# Patient Record
Sex: Male | Born: 1970 | Race: White | Hispanic: No | Marital: Married | Smoking: Never smoker
Health system: Southern US, Community
[De-identification: ages and names within clinical notes are randomized; demographics above are authoritative.]

## PROBLEM LIST (undated history)

## (undated) DIAGNOSIS — M1990 Unspecified osteoarthritis, unspecified site: Secondary | ICD-10-CM

## (undated) DIAGNOSIS — G43909 Migraine, unspecified, not intractable, without status migrainosus: Secondary | ICD-10-CM

## (undated) DIAGNOSIS — M5020 Other cervical disc displacement, unspecified cervical region: Secondary | ICD-10-CM

## (undated) DIAGNOSIS — H9190 Unspecified hearing loss, unspecified ear: Secondary | ICD-10-CM

## (undated) DIAGNOSIS — I10 Essential (primary) hypertension: Secondary | ICD-10-CM

## (undated) DIAGNOSIS — F5104 Psychophysiologic insomnia: Secondary | ICD-10-CM

## (undated) HISTORY — DX: Migraine, unspecified, not intractable, without status migrainosus: G43.909

## (undated) HISTORY — DX: Unspecified hearing loss, unspecified ear: H91.90

## (undated) HISTORY — PX: VASECTOMY: SHX75

## (undated) HISTORY — DX: Psychophysiologic insomnia: F51.04

---

## 2003-07-27 ENCOUNTER — Emergency Department (HOSPITAL_COMMUNITY): Admission: EM | Admit: 2003-07-27 | Discharge: 2003-07-27 | Payer: Self-pay | Admitting: Emergency Medicine

## 2004-10-19 ENCOUNTER — Ambulatory Visit: Payer: Self-pay | Admitting: Family Medicine

## 2004-11-01 ENCOUNTER — Ambulatory Visit: Payer: Self-pay | Admitting: Family Medicine

## 2006-03-19 ENCOUNTER — Ambulatory Visit: Payer: Self-pay | Admitting: Family Medicine

## 2016-01-29 ENCOUNTER — Emergency Department (HOSPITAL_COMMUNITY): Payer: Self-pay

## 2016-01-29 ENCOUNTER — Encounter (HOSPITAL_COMMUNITY): Payer: Self-pay | Admitting: Emergency Medicine

## 2016-01-29 ENCOUNTER — Emergency Department (HOSPITAL_COMMUNITY)
Admission: EM | Admit: 2016-01-29 | Discharge: 2016-01-29 | Disposition: A | Discharge status: Home / Self Care | Payer: Self-pay | Attending: Emergency Medicine | Admitting: Emergency Medicine

## 2016-01-29 DIAGNOSIS — R4781 Slurred speech: Secondary | ICD-10-CM | POA: Insufficient documentation

## 2016-01-29 DIAGNOSIS — Z792 Long term (current) use of antibiotics: Secondary | ICD-10-CM | POA: Insufficient documentation

## 2016-01-29 DIAGNOSIS — R5383 Other fatigue: Secondary | ICD-10-CM | POA: Insufficient documentation

## 2016-01-29 DIAGNOSIS — I10 Essential (primary) hypertension: Secondary | ICD-10-CM | POA: Insufficient documentation

## 2016-01-29 DIAGNOSIS — F1110 Opioid abuse, uncomplicated: Secondary | ICD-10-CM | POA: Insufficient documentation

## 2016-01-29 DIAGNOSIS — Z79899 Other long term (current) drug therapy: Secondary | ICD-10-CM | POA: Insufficient documentation

## 2016-01-29 DIAGNOSIS — H9190 Unspecified hearing loss, unspecified ear: Secondary | ICD-10-CM | POA: Insufficient documentation

## 2016-01-29 DIAGNOSIS — G8929 Other chronic pain: Secondary | ICD-10-CM | POA: Insufficient documentation

## 2016-01-29 DIAGNOSIS — M1990 Unspecified osteoarthritis, unspecified site: Secondary | ICD-10-CM | POA: Insufficient documentation

## 2016-01-29 HISTORY — DX: Unspecified osteoarthritis, unspecified site: M19.90

## 2016-01-29 HISTORY — DX: Essential (primary) hypertension: I10

## 2016-01-29 LAB — COMPREHENSIVE METABOLIC PANEL
ALT: 17 U/L (ref 17–63)
AST: 19 U/L (ref 15–41)
Albumin: 3.7 g/dL (ref 3.5–5.0)
Alkaline Phosphatase: 58 U/L (ref 38–126)
Anion gap: 9 (ref 5–15)
BUN: <5 mg/dL — ABNORMAL LOW (ref 6–20)
CO2: 28 mmol/L (ref 22–32)
Calcium: 8.7 mg/dL — ABNORMAL LOW (ref 8.9–10.3)
Chloride: 100 mmol/L — ABNORMAL LOW (ref 101–111)
Creatinine, Ser: 0.97 mg/dL (ref 0.61–1.24)
GFR calc Af Amer: >60 mL/min (ref >60)
GFR calc non Af Amer: >60 mL/min (ref >60)
Glucose, Bld: 126 mg/dL — ABNORMAL HIGH (ref 65–99)
Potassium: 3.6 mmol/L (ref 3.5–5.1)
Sodium: 137 mmol/L (ref 135–145)
Total Bilirubin: 0.4 mg/dL (ref 0.3–1.2)
Total Protein: 7 g/dL (ref 6.5–8.1)

## 2016-01-29 LAB — DIFFERENTIAL
Basophils Absolute: 0 10*3/uL (ref 0.0–0.1)
Basophils Relative: 0 %
Eosinophils Absolute: 0.7 10*3/uL (ref 0.0–0.7)
Eosinophils Relative: 10 %
Lymphocytes Relative: 45 %
Lymphs Abs: 3 10*3/uL (ref 0.7–4.0)
Monocytes Absolute: 0.5 10*3/uL (ref 0.1–1.0)
Monocytes Relative: 7 %
Neutro Abs: 2.5 10*3/uL (ref 1.7–7.7)
Neutrophils Relative %: 38 %

## 2016-01-29 LAB — I-STAT CHEM 8, ED
BUN: 5 mg/dL — ABNORMAL LOW (ref 6–20)
Calcium, Ion: 1.12 mmol/L (ref 1.12–1.23)
Chloride: 97 mmol/L — ABNORMAL LOW (ref 101–111)
Creatinine, Ser: 1 mg/dL (ref 0.61–1.24)
Glucose, Bld: 117 mg/dL — ABNORMAL HIGH (ref 65–99)
HCT: 46 % (ref 39.0–52.0)
Hemoglobin: 15.6 g/dL (ref 13.0–17.0)
Potassium: 3.5 mmol/L (ref 3.5–5.1)
Sodium: 140 mmol/L (ref 135–145)
TCO2: 28 mmol/L (ref 0–100)

## 2016-01-29 LAB — RAPID URINE DRUG SCREEN, HOSP PERFORMED
Amphetamines: NOT DETECTED
Barbiturates: NOT DETECTED
Benzodiazepines: NOT DETECTED
Cocaine: NOT DETECTED
Opiates: POSITIVE — AB
Tetrahydrocannabinol: NOT DETECTED

## 2016-01-29 LAB — CBC
HCT: 42 % (ref 39.0–52.0)
Hemoglobin: 13.8 g/dL (ref 13.0–17.0)
MCH: 28.5 pg (ref 26.0–34.0)
MCHC: 32.9 g/dL (ref 30.0–36.0)
MCV: 86.6 fL (ref 78.0–100.0)
Platelets: 329 10*3/uL (ref 150–400)
RBC: 4.85 MIL/uL (ref 4.22–5.81)
RDW: 13.8 % (ref 11.5–15.5)
WBC: 6.7 10*3/uL (ref 4.0–10.5)

## 2016-01-29 LAB — PROTIME-INR
INR: 1.02 (ref 0.00–1.49)
Prothrombin Time: 13.6 seconds (ref 11.6–15.2)

## 2016-01-29 LAB — CBG MONITORING, ED: Glucose-Capillary: 128 mg/dL — ABNORMAL HIGH (ref 65–99)

## 2016-01-29 LAB — I-STAT TROPONIN, ED: Troponin i, poc: 0 ng/mL (ref 0.00–0.08)

## 2016-01-29 LAB — APTT: aPTT: 35 seconds (ref 24–37)

## 2016-01-29 LAB — ETHANOL: Alcohol, Ethyl (B): <5 mg/dL (ref <5)

## 2016-01-29 NOTE — ED Provider Notes (Signed)
CSN: [id number]     Arrival date & time 01/29/16  2002 History   First MD Initiated Contact with Patient 01/29/16 2047     Chief Complaint  Patient presents with  . Aphasia  . Stroke Symptoms     (Consider location/radiation/quality/duration/timing/severity/associated sxs/prior Treatment) HPI  Blood pressure 151/97, pulse 62, temperature 97.8 F (36.6 C), temperature source Oral, resp. rate 20, height 6\' 1"  (1.854 m), weight 106.595 kg, SpO2 98 %.  Cody French is a 45 y.o. male with past medical history significant for hypertension and arthritis with chronic pain complaining of slurred speech onset several weeks ago, her sitting slightly today. Pt denies head trauma, LOC, N/V, change in vision (however wife states that she's noticed it's difficult for him to put his pain and when he is purchasing items via debit card, she's had 2 anterior for him twice in the last 2 days, states he's using his phone normally.), Ataxia, numbness, weakness, chest pain, cervicalgia, fever, chills, illicit drug use. States he has chronic pain and he took 80 mg OxyContin extended release today. States that his voice sounds normal to him, denies sore throat, dysphagia.   Past Medical History  Diagnosis Date  . Hypertension   . Arthritis    History reviewed. No pertinent past surgical history. No family history on file. Social History  Substance Use Topics  . Smoking status: Never Smoker   . Smokeless tobacco: Current User  . Alcohol Use: No    Review of Systems  10 systems reviewed and found to be negative, except as noted in the HPI.   Allergies  Review of patient's allergies indicates no known allergies.  Home Medications   Prior to Admission medications   Medication Sig Start Date End Date Taking? Authorizing Provider  amLODipine (NORVASC) 10 MG tablet Take 10 mg by mouth every evening.   Yes Historical Provider, MD  Armodafinil (NUVIGIL) 50 MG tablet Take 50 mg by mouth daily as needed  (for wakefulness).    Yes Historical Provider, MD  celecoxib (CELEBREX) 200 MG capsule Take 200 mg by mouth daily.   Yes Historical Provider, MD  gabapentin (NEURONTIN) 300 MG capsule Take 900 mg by mouth at bedtime. 10/31/15  Yes Historical Provider, MD  Glucosamine HCl (SM GLUCOSAMINE HCL) 1500 MG TABS Take 1 tablet by mouth every evening.   Yes Historical Provider, MD  oxyCODONE (OXYCONTIN) 80 mg 12 hr tablet Take 80 mg by mouth 2 (two) times daily as needed.   Yes Historical Provider, MD  Testosterone (AXIRON) 30 MG/ACT SOLN Place 60 mg onto the skin daily.   Yes Historical Provider, MD   BP 111/67 mmHg  Pulse 57  Temp(Src) 97.8 F (36.6 C) (Oral)  Resp 12  Ht 6\' 1"  (1.854 m)  Wt 106.595 kg  BMI 31.01 kg/m2  SpO2 93% Physical Exam  Constitutional: He is oriented to person, place, and time. He appears well-developed and well-nourished. No distress.  Appears tired, states that he's been working since 1 AM.  Hard of hearing, does not have his hearing aids with him.  HENT:  Head: Normocephalic and atraumatic.  Mouth/Throat: Oropharynx is clear and moist.  Eyes: Conjunctivae and EOM are normal. Pupils are equal, round, and reactive to light.  No TTP of maxillary or frontal sinuses  No TTP or induration of temporal arteries bilaterally  Neck: Normal range of motion. Neck supple.  FROM to C-spine. Pt can touch chin to chest without discomfort. No TTP of midline cervical spine.  Cardiovascular: Normal rate, regular rhythm and intact distal pulses.   Pulmonary/Chest: Effort normal and breath sounds normal. No stridor. No respiratory distress. He has no wheezes. He has no rales. He exhibits no tenderness.  Abdominal: Soft. Bowel sounds are normal. There is no tenderness.  Musculoskeletal: Normal range of motion. He exhibits no edema or tenderness.  Neurological: He is alert and oriented to person, place, and time. No cranial nerve deficit.  II-Visual fields grossly  intact. III/IV/VI-Extraocular movements intact.  Pupils reactive bilaterally. V/VII-Smile symmetric, equal eyebrow raise,  facial sensation intact VIII- Hearing grossly intact IX/X-Normal gag XI-bilateral shoulder shrug XII-midline tongue extension Motor: 5/5 bilaterally with normal tone and bulk Cerebellar: Normal finger-to-nose  and normal heel-to-shin test.   Romberg negative Ambulates with a coordinated gait   Psychiatric: He has a normal mood and affect.  Nursing note and vitals reviewed.   ED Course  Procedures (including critical care time) Labs Review Labs Reviewed  COMPREHENSIVE METABOLIC PANEL - Abnormal; Notable for the following:    Chloride 100 (*)    Glucose, Bld 126 (*)    BUN <5 (*)    Calcium 8.7 (*)    All other components within normal limits  URINE RAPID DRUG SCREEN, HOSP PERFORMED - Abnormal; Notable for the following:    Opiates POSITIVE (*)    All other components within normal limits  CBG MONITORING, ED - Abnormal; Notable for the following:    Glucose-Capillary 128 (*)    All other components within normal limits  I-STAT CHEM 8, ED - Abnormal; Notable for the following:    Chloride 97 (*)    BUN 5 (*)    Glucose, Bld 117 (*)    All other components within normal limits  PROTIME-INR  APTT  CBC  DIFFERENTIAL  ETHANOL  I-STAT TROPOININ, ED    Imaging Review Ct Head Wo Contrast  01/29/2016  CLINICAL DATA:  45 year old male with slurred speech EXAM: CT HEAD WITHOUT CONTRAST TECHNIQUE: Contiguous axial images were obtained from the base of the skull through the vertex without intravenous contrast. COMPARISON:  None FINDINGS: The ventricles and the sulci are appropriate in size for the patient's age. There is no intracranial hemorrhage. No midline shift or mass effect identified. The gray-white matter differentiation is preserved. There is mild mucoperiosteal thickening of the paranasal sinuses. No air-fluid levels. The mastoid air cells are clear. The  calvarium is intact. The calvarium is intact. IMPRESSION: No acute intracranial pathology. Electronically Signed   By: Elgie Collard M.D.   On: 01/29/2016 22:53   I have personally reviewed and evaluated these images and lab results as part of my medical decision-making.   EKG Interpretation   Date/Time:  Sunday Jan 29 2016 20:29:14 EDT Ventricular Rate:  60 PR Interval:  156 QRS Duration: 102 QT Interval:  416 QTC Calculation: 416 R Axis:   18 Text Interpretation:  Normal sinus rhythm Cannot rule out Anterior infarct  , age undetermined Abnormal ECG Confirmed by Donnald Garre, MD, [facility] [phone])  on 01/29/2016 11:34:29 PM      MDM   Final diagnoses:  Slurred speech  Other fatigue    Filed Vitals:   01/29/16 2200 01/29/16 2230 01/29/16 2300 01/29/16 2330  BP: 119/80 118/74 108/74 111/67  Pulse: 59 58 55 57  Temp:      TempSrc:      Resp: Height:      Weight:      SpO2: 92% 90% 92% 93%  Cody French is 45 y.o. male presenting with Isolated slurred speech onset several weeks ago exam is otherwise nonfocal. She is well out of the code stroke window. This may be related to Polypharmacy   CT head negative, blood work reassuring, negative ethanol, positive opiates.   Take discussed with neuro hospitalist Dr. Roseanne RenoStewart, given his negative workup, length of symptoms and reassuring neurologic exam states he is appropriate for follow-up as an outpatient. Discussed case with wife who will set up appointment, feel that this may be secondary to exhaustion. Work note provided.  Evaluation does not show pathology that would require ongoing emergent intervention or inpatient treatment. Pt is hemodynamically stable and mentating appropriately. Discussed findings and plan with patient/guardian, who agrees with care plan. All questions answered. Return precautions discussed and outpatient follow up given.       Wynetta Emeryicole Tavin Vernet, PA-C 01/29/16 2352  Arby BarretteMarcy Pfeiffer,  MD 02/02/16 [phone]

## 2016-01-29 NOTE — Discharge Instructions (Signed)
Please follow with your primary care doctor in the next 2 days for a check-up. They must obtain records for further management.  ° °Do not hesitate to return to the Emergency Department for any new, worsening or concerning symptoms.  ° °

## 2016-01-29 NOTE — ED Notes (Signed)
Patient transported to CT 

## 2016-01-29 NOTE — ED Notes (Signed)
Per pt family member, has had trouble speaking for the last few weeks, states its been getting worse today. Pt has obvious difficulty speaking and his speech is slurred. Pt is AAOX4 at this time. Answers orientation questions correctly. Grip strength strong and equal, no facial droop, no arm drift. Pt denies pain at this time.

## 2016-02-08 ENCOUNTER — Encounter: Payer: Self-pay | Admitting: Neurology

## 2016-02-08 ENCOUNTER — Ambulatory Visit (INDEPENDENT_AMBULATORY_CARE_PROVIDER_SITE_OTHER): Payer: BLUE CROSS/BLUE SHIELD | Admitting: Neurology

## 2016-02-08 VITALS — BP 158/90 | HR 58 | Ht 73.0 in | Wt 243.0 lb

## 2016-02-08 DIAGNOSIS — R471 Dysarthria and anarthria: Secondary | ICD-10-CM | POA: Diagnosis not present

## 2016-02-08 NOTE — Progress Notes (Signed)
3   Reason for visit: Speech disturbance  Referring physician: [name]  Cody French is a 45 y.o. male  History of present illness:  Cody French is a 45 year old right-handed white male with a history of onset of a speech disturbance that has come on gradually over the last month prior to this evaluation. The patient was in the emergency room on 01/29/2016 with complaints of the speech. The patient has a hesitant at times stuttering type speech. The patient denies any visual changes, numbness or weakness of the arms or legs with exception of some numbness in the hands bilaterally, right greater than left, felt secondary to carpal tunnel syndrome. The patient has some slight neck discomfort at times. He denies any back pain. He denies any alteration in balance or difficulty controlling the bowels or the bladder. The patient is not sleeping well, he takes Nuvigil at times. The patient does have some decreased auditory acuity, he wears hearing aids. The patient has a history of migraine headaches, but this has not been much of an issue for him recently. He is sent to this office for an evaluation. A CT scan of the brain done in the emergency room was unremarkable. The patient indicates that he has had a similar problem with speech greater than 20 years ago when he had a head injury in [place]. This speech problem cleared spontaneously in about 3 months.  Past Medical History  Diagnosis Date  . Hypertension   . Arthritis   . Migraine   . Chronic insomnia   . HOH (hard of hearing)     Past Surgical History  Procedure Laterality Date  . Vasectomy      Family History  Problem Relation Age of Onset  . Stroke Paternal Grandfather   . Migraines Mother     Social history:  reports that he has never smoked. He uses smokeless tobacco. He reports that he does not drink alcohol or use illicit drugs.  Medications:  Prior to Admission medications   Medication Sig Start Date End Date Taking?  Authorizing Provider  amLODipine (NORVASC) 10 MG tablet Take 10 mg by mouth every evening.    Historical Provider, MD  Armodafinil (NUVIGIL) 50 MG tablet Take 50 mg by mouth daily as needed (for wakefulness).     Historical Provider, MD  celecoxib (CELEBREX) 200 MG capsule Take 200 mg by mouth daily.    Historical Provider, MD  gabapentin (NEURONTIN) 300 MG capsule Take 900 mg by mouth at bedtime. 10/31/15   Historical Provider, MD  Glucosamine HCl (SM GLUCOSAMINE HCL) 1500 MG TABS Take 1 tablet by mouth every evening.    Historical Provider, MD  oxyCODONE (OXYCONTIN) 80 mg 12 hr tablet Take 80 mg by mouth 2 (two) times daily as needed.    Historical Provider, MD  Testosterone (AXIRON) 30 MG/ACT SOLN Place 60 mg onto the skin daily.    Historical Provider, MD     No Known Allergies  ROS:  Out of a complete 14 system review of symptoms, the patient complains only of the following symptoms, and all other reviewed systems are negative.  Loss of vision, snoring Runny nose Slurred speech Not enough sleep Snoring  Blood pressure 158/90, pulse 58, height  (1.854 m), weight 243 lb (110.224 kg).  Physical Exam  General: The patient is alert and cooperative at the time of the examination.  Eyes: Pupils are equal, round, and reactive to light. Discs are flat bilaterally.  Neck: The neck  is supple, no carotid bruits are noted.  Respiratory: The respiratory examination is clear.  Cardiovascular: The cardiovascular examination reveals a regular rate and rhythm, no obvious murmurs or rubs are noted.  Skin: Extremities are without significant edema.  Neurologic Exam  Mental status: The patient is alert and oriented x 3 at the time of the examination. The patient has apparent normal recent and remote memory, with an apparently normal attention span and concentration ability.  Cranial nerves: Facial symmetry is present. There is good sensation of the face to pinprick and soft touch  bilaterally. The strength of the facial muscles and the muscles to head turning and shoulder shrug are normal bilaterally. Speech is hesitant, stuttering at times, not aphasic. Extraocular movements are full. Visual fields are full. The tongue is midline, and the patient has symmetric elevation of the soft palate. No obvious hearing deficits are noted.  Motor: The motor testing reveals 5 over 5 strength of all 4 extremities. Good symmetric motor tone is noted throughout.  Sensory: Sensory testing is intact to pinprick, soft touch, vibration sensation, and position sense on all 4 extremities. No evidence of extinction is noted.  Coordination: Cerebellar testing reveals good finger-nose-finger and heel-to-shin bilaterally.  Gait and station: Gait is normal. Tandem gait is normal. Romberg is negative. No drift is seen.  Reflexes: Deep tendon reflexes are symmetric and normal bilaterally. Toes are downgoing bilaterally.   CT head 01/29/16:  IMPRESSION: No acute intracranial pathology.  * CT scan images were reviewed online. I agree with the written report.    Assessment/Plan:  1. Speech alteration  The patient has had some problems with hesitancy of speech in stuttering speech over the last month. The clinical examination today is otherwise unremarkable. This issue could be psychogenic in nature, but further workup is indicated. He will have MRI of the brain with and without gadolinium enhancement, and an EEG study. He will follow-up in 3 months.   [name]. [name] MD 02/08/2016 7:52 PM  [facility] [address] [place], [place] [id number]  Phone [phone] Fax [phone]

## 2016-02-22 ENCOUNTER — Telehealth: Payer: Self-pay | Admitting: Neurology

## 2016-02-22 NOTE — Telephone Encounter (Signed)
Pt returned call  about MRI appt. They are requesting a call back today. Please call Candy at [phone]

## 2016-02-24 NOTE — Telephone Encounter (Signed)
Called Cody French back as requested and [facility]. Asked her to call me back at her earliest convenience.

## 2016-02-24 NOTE — Telephone Encounter (Signed)
Spoke with the patient and scheduled apt. Also spoke with his wife and explained I had tried calling him to schedule and left a VM, the patient stated that he had just checked his messages and received the message.

## 2016-02-24 NOTE — Telephone Encounter (Signed)
Pt's wife is requesting a call about MRI scheduling as soon as possible.

## 2016-03-05 ENCOUNTER — Ambulatory Visit (INDEPENDENT_AMBULATORY_CARE_PROVIDER_SITE_OTHER): Payer: BLUE CROSS/BLUE SHIELD

## 2016-03-05 DIAGNOSIS — R471 Dysarthria and anarthria: Secondary | ICD-10-CM

## 2016-03-07 ENCOUNTER — Ambulatory Visit (INDEPENDENT_AMBULATORY_CARE_PROVIDER_SITE_OTHER): Payer: BLUE CROSS/BLUE SHIELD

## 2016-03-07 ENCOUNTER — Telehealth: Payer: Self-pay | Admitting: Neurology

## 2016-03-07 DIAGNOSIS — R471 Dysarthria and anarthria: Secondary | ICD-10-CM | POA: Diagnosis not present

## 2016-03-07 NOTE — Telephone Encounter (Signed)
I called the patient. EEG study was normal, MRI the brain is pending.

## 2016-03-07 NOTE — Procedures (Signed)
    History:  Cody DinningRobert French is a 45 year old gentleman with a history of onset of a stuttering speech pattern that began in early May 2017. The patient is being evaluated for this issue.  This is a routine EEG. No skull defects are noted. Medications include Norvasc, Nuvigil, Celebrex, gabapentin, OxyContin, and testosterone supplementation.   EEG classification: Normal awake  Description of the recording: The background rhythms of this recording consists of a fairly well modulated medium amplitude alpha rhythm of 9 Hz that is reactive to eye opening and closure. As the record progresses, the patient appears to remain in the waking state throughout the recording. Photic stimulation was not performed. Hyperventilation was performed, resulting in a minimal buildup of the background rhythm activities without significant slowing seen. At no time during the recording does there appear to be evidence of spike or spike wave discharges or evidence of focal slowing. EKG monitor shows no evidence of cardiac rhythm abnormalities with a heart rate of 66.  Impression: This is a normal EEG recording in the waking state. No evidence of ictal or interictal discharges are seen.

## 2016-03-08 ENCOUNTER — Telehealth: Payer: Self-pay | Admitting: Neurology

## 2016-03-08 MED ORDER — GADOPENTETATE DIMEGLUMINE 469.01 MG/ML IV SOLN: 20.0000 mL | Freq: Once | INTRAVENOUS | Status: AC | PRN

## 2016-03-08 NOTE — Telephone Encounter (Signed)
Called and spoke to pt. Let him know that MRI was also normal. He continues to have problems w/ his speech. Said that he would check his work schedule and then call back to schedule sooner f/u appt.

## 2016-03-08 NOTE — Telephone Encounter (Signed)
Wife called to request MRI results, please call [phone].

## 2016-03-08 NOTE — Telephone Encounter (Signed)
I called the patient. MRI the brain is unremarkable. EEG study was normal. If the speech issue continues, we may consider speech therapy for him, this could potentially be a psychogenic event.   MRI brain 03/07/16:  IMPRESSION: This is a normal MRI of the brain with and without contrast.

## 2016-05-10 ENCOUNTER — Ambulatory Visit: Payer: BLUE CROSS/BLUE SHIELD | Admitting: Neurology

## 2016-05-15 ENCOUNTER — Emergency Department (HOSPITAL_COMMUNITY): Payer: No Typology Code available for payment source

## 2016-05-15 ENCOUNTER — Emergency Department (HOSPITAL_COMMUNITY)
Admission: EM | Admit: 2016-05-15 | Discharge: 2016-05-15 | Disposition: A | Discharge status: Home / Self Care | Payer: No Typology Code available for payment source | Attending: Emergency Medicine | Admitting: Emergency Medicine

## 2016-05-15 ENCOUNTER — Encounter (HOSPITAL_COMMUNITY): Payer: Self-pay

## 2016-05-15 DIAGNOSIS — Y999 Unspecified external cause status: Secondary | ICD-10-CM | POA: Diagnosis not present

## 2016-05-15 DIAGNOSIS — M5410 Radiculopathy, site unspecified: Secondary | ICD-10-CM

## 2016-05-15 DIAGNOSIS — Y939 Activity, unspecified: Secondary | ICD-10-CM | POA: Insufficient documentation

## 2016-05-15 DIAGNOSIS — M79604 Pain in right leg: Secondary | ICD-10-CM | POA: Diagnosis not present

## 2016-05-15 DIAGNOSIS — Z79899 Other long term (current) drug therapy: Secondary | ICD-10-CM | POA: Insufficient documentation

## 2016-05-15 DIAGNOSIS — M545 Low back pain: Secondary | ICD-10-CM | POA: Diagnosis present

## 2016-05-15 DIAGNOSIS — I10 Essential (primary) hypertension: Secondary | ICD-10-CM | POA: Diagnosis not present

## 2016-05-15 DIAGNOSIS — Y92410 Unspecified street and highway as the place of occurrence of the external cause: Secondary | ICD-10-CM | POA: Diagnosis not present

## 2016-05-15 DIAGNOSIS — M5441 Lumbago with sciatica, right side: Secondary | ICD-10-CM | POA: Diagnosis not present

## 2016-05-15 MED ORDER — NAPROXEN 500 MG PO TABS: 500.0000 mg | ORAL_TABLET | Freq: Two times a day (BID) | ORAL | 0 refills | Status: AC

## 2016-05-15 MED ORDER — IBUPROFEN 400 MG PO TABS
800.0000 mg | ORAL_TABLET | Freq: Once | ORAL | Status: AC
  Administered 2016-05-15: 800 mg via ORAL
  Filled 2016-05-15: qty 2

## 2016-05-15 MED ORDER — PREDNISONE 20 MG PO TABS: ORAL_TABLET | ORAL | 0 refills | Status: DC

## 2016-05-15 MED ORDER — METHOCARBAMOL 500 MG PO TABS: 500.0000 mg | ORAL_TABLET | Freq: Two times a day (BID) | ORAL | 0 refills | Status: DC

## 2016-05-15 NOTE — Discharge Instructions (Signed)
1. Medications: robaxin, naproxyn, prednisone, usual home medications 2. Treatment: rest, drink plenty of fluids, gentle stretching as discussed, alternate ice and heat 3. Follow Up: Please followup with your primary doctor in 3 days for discussion of your diagnoses and further evaluation after today's visit; if you do not have a primary care doctor use the resource guide provided to find one;  Return to the ER for worsening back pain, difficulty walking, loss of bowel or bladder control or other concerning symptoms    

## 2016-05-15 NOTE — ED Notes (Signed)
Patient transported to X-ray 

## 2016-05-15 NOTE — ED Notes (Signed)
Pt back from radiology 
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
## 2016-05-15 NOTE — ED Notes (Signed)
Pt ambulated independently to restroom. Pt observed walking with slight limp.

## 2016-05-15 NOTE — ED Notes (Signed)
Pt verbalized understanding of d/c instructions and has no further questions. Pt stable and NAD. Pt denied wheelchair and ambulated to the lobby.
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
## 2016-05-15 NOTE — ED Notes (Signed)
Pt transported to xray 

## 2017-01-10 ENCOUNTER — Other Ambulatory Visit: Payer: Self-pay | Admitting: Gastroenterology

## 2017-01-10 DIAGNOSIS — R1310 Dysphagia, unspecified: Secondary | ICD-10-CM

## 2017-01-11 ENCOUNTER — Ambulatory Visit
Admission: RE | Admit: 2017-01-11 | Discharge: 2017-01-11 | Disposition: A | Discharge status: Home / Self Care | Payer: Commercial Managed Care - PPO | Source: Ambulatory Visit | Attending: Gastroenterology | Admitting: Gastroenterology

## 2017-01-11 DIAGNOSIS — R1310 Dysphagia, unspecified: Secondary | ICD-10-CM

## 2017-03-18 ENCOUNTER — Emergency Department (HOSPITAL_COMMUNITY)
Admission: EM | Admit: 2017-03-18 | Discharge: 2017-03-18 | Disposition: A | Discharge status: Home / Self Care | Payer: Worker's Compensation | Attending: Emergency Medicine | Admitting: Emergency Medicine

## 2017-03-18 ENCOUNTER — Emergency Department (HOSPITAL_COMMUNITY): Payer: Worker's Compensation

## 2017-03-18 ENCOUNTER — Encounter (HOSPITAL_COMMUNITY): Payer: Self-pay | Admitting: Emergency Medicine

## 2017-03-18 DIAGNOSIS — I10 Essential (primary) hypertension: Secondary | ICD-10-CM | POA: Insufficient documentation

## 2017-03-18 DIAGNOSIS — W19XXXA Unspecified fall, initial encounter: Secondary | ICD-10-CM

## 2017-03-18 DIAGNOSIS — W11XXXA Fall on and from ladder, initial encounter: Secondary | ICD-10-CM | POA: Insufficient documentation

## 2017-03-18 DIAGNOSIS — R10812 Left upper quadrant abdominal tenderness: Secondary | ICD-10-CM | POA: Diagnosis not present

## 2017-03-18 DIAGNOSIS — Y9389 Activity, other specified: Secondary | ICD-10-CM | POA: Insufficient documentation

## 2017-03-18 DIAGNOSIS — Y929 Unspecified place or not applicable: Secondary | ICD-10-CM | POA: Diagnosis not present

## 2017-03-18 DIAGNOSIS — Y999 Unspecified external cause status: Secondary | ICD-10-CM | POA: Insufficient documentation

## 2017-03-18 DIAGNOSIS — S20212A Contusion of left front wall of thorax, initial encounter: Secondary | ICD-10-CM

## 2017-03-18 DIAGNOSIS — R079 Chest pain, unspecified: Secondary | ICD-10-CM | POA: Diagnosis present

## 2017-03-18 DIAGNOSIS — Z79899 Other long term (current) drug therapy: Secondary | ICD-10-CM | POA: Diagnosis not present

## 2017-03-18 DIAGNOSIS — S20222A Contusion of left back wall of thorax, initial encounter: Secondary | ICD-10-CM | POA: Insufficient documentation

## 2017-03-18 HISTORY — DX: Other cervical disc displacement, unspecified cervical region: M50.20

## 2017-03-18 LAB — CBC WITH DIFFERENTIAL/PLATELET
BASOS ABS: 0 10*3/uL (ref 0.0–0.1)
Basophils Relative: 0 %
EOS PCT: 3 %
Eosinophils Absolute: 0.2 10*3/uL (ref 0.0–0.7)
HEMATOCRIT: 41.4 % (ref 39.0–52.0)
HEMOGLOBIN: 13.7 g/dL (ref 13.0–17.0)
LYMPHS ABS: 2 10*3/uL (ref 0.7–4.0)
LYMPHS PCT: 26 %
MCH: 29.1 pg (ref 26.0–34.0)
MCHC: 33.1 g/dL (ref 30.0–36.0)
MCV: 87.9 fL (ref 78.0–100.0)
Monocytes Absolute: 0.6 10*3/uL (ref 0.1–1.0)
Monocytes Relative: 8 %
NEUTROS PCT: 63 %
Neutro Abs: 4.8 10*3/uL (ref 1.7–7.7)
PLATELETS: 222 10*3/uL (ref 150–400)
RBC: 4.71 MIL/uL (ref 4.22–5.81)
RDW: 13.8 % (ref 11.5–15.5)
WBC: 7.7 10*3/uL (ref 4.0–10.5)

## 2017-03-18 LAB — I-STAT CHEM 8, ED
BUN: 7 mg/dL (ref 6–20)
CALCIUM ION: 1.12 mmol/L — AB (ref 1.15–1.40)
CHLORIDE: 100 mmol/L — AB (ref 101–111)
CREATININE: 0.9 mg/dL (ref 0.61–1.24)
GLUCOSE: 102 mg/dL — AB (ref 65–99)
HCT: 43 % (ref 39.0–52.0)
Hemoglobin: 14.6 g/dL (ref 13.0–17.0)
POTASSIUM: 3.9 mmol/L (ref 3.5–5.1)
Sodium: 138 mmol/L (ref 135–145)
TCO2: 28 mmol/L (ref 0–100)

## 2017-03-18 LAB — I-STAT TROPONIN, ED: TROPONIN I, POC: 0 ng/mL (ref 0.00–0.08)

## 2017-03-18 MED ORDER — MORPHINE SULFATE (PF) 4 MG/ML IV SOLN
4.0000 mg | Freq: Once | INTRAVENOUS | Status: AC
  Administered 2017-03-18: 4 mg via INTRAVENOUS
  Filled 2017-03-18: qty 1

## 2017-03-18 MED ORDER — METAXALONE 800 MG PO TABS: 400.0000 mg | ORAL_TABLET | Freq: Three times a day (TID) | ORAL | 0 refills | Status: AC | PRN

## 2017-03-18 MED ORDER — SODIUM CHLORIDE 0.9 % IV BOLUS (SEPSIS)
500.0000 mL | Freq: Once | INTRAVENOUS | Status: AC
  Administered 2017-03-18: 500 mL via INTRAVENOUS

## 2017-03-18 MED ORDER — IOPAMIDOL (ISOVUE-300) INJECTION 61%
INTRAVENOUS | Status: AC
  Administered 2017-03-18: 100 mL
  Filled 2017-03-18: qty 100

## 2017-03-18 NOTE — ED Notes (Signed)
Portable chest x-ray at this time.

## 2017-03-18 NOTE — ED Notes (Signed)
[place] sandwich meal and coca cola provided to patient - approved by Dr. Rubin PayorPickering.

## 2017-03-18 NOTE — ED Provider Notes (Signed)
[facility] Provider Note   CSN: [id number] Arrival date & time: 03/18/17  1102     History   Chief Complaint Chief Complaint  Patient presents with  . Fall    HPI Cody French is a 46 y.o. male.  HPI Patient presents after fall. Larey SeatFell off a 3 step ladder. Landed onto a table on his left ribs. Severe pain in that area. No loss conscious. Slight pain on left hip 2 and has been able to inability. Refused EMS pain medicine but willing to get some here. Not on blood thinners. Did not hit head. Pain with breathing does not feel short of breath.   Past Medical History:  Diagnosis Date  . Arthritis   . Bulging of cervical intervertebral disc   . Chronic insomnia   . HOH (hard of hearing)   . Hypertension   . Migraine     Patient Active Problem List   Diagnosis Date Noted  . Dysarthria 02/08/2016    Past Surgical History:  Procedure Laterality Date  . VASECTOMY         Home Medications    Prior to Admission medications   Medication Sig Start Date End Date Taking? Authorizing Provider  amLODipine (NORVASC) 10 MG tablet Take 10 mg by mouth daily as needed (blood pressure increase).    Yes [provider]  Armodafinil (NUVIGIL) 50 MG tablet Take 50 mg by mouth daily as needed (for wakefulness).    Yes [provider]  naproxen (NAPROSYN) 500 MG tablet Take 1 tablet (500 mg total) by mouth 2 (two) times daily with a meal. Patient taking differently: Take 500 mg by mouth 2 (two) times daily as needed for mild pain.  05/15/16  Yes Muthersbaugh, Dahlia ClientHannah, PA-C  omeprazole (PRILOSEC) 40 MG capsule Take 40 mg by mouth 2 (two) times daily. 01/22/17  Yes [provider]  oxyCODONE (OXYCONTIN) 80 mg 12 hr tablet Take 40 mg by mouth 2 (two) times daily as needed (pain).    Yes [provider]  Testosterone (AXIRON) 30 MG/ACT SOLN Place 60 mg onto the skin daily.   Yes [provider]  metaxalone (SKELAXIN) 800 MG tablet Take 0.5  tablets (400 mg total) by mouth 3 (three) times daily as needed for muscle spasms. 03/18/17   Benjiman CorePickering, Navaeh Kehres, MD  methocarbamol (ROBAXIN) 500 MG tablet Take 1 tablet (500 mg total) by mouth 2 (two) times daily. Patient not taking: Reported on 03/18/2017 05/15/16   Muthersbaugh, Dahlia ClientHannah, PA-C  predniSONE (DELTASONE) 20 MG tablet 3 tabs po daily x 3 days, then 2 tabs x 3 days, then 1.5 tabs x 3 days, then 1 tab x 3 days, then 0.5 tabs x 3 days Patient not taking: Reported on 03/18/2017 05/15/16   Muthersbaugh, Dahlia ClientHannah, PA-C    Family History Family History  Problem Relation Age of Onset  . Stroke Paternal Grandfather   . Migraines Mother     Social History Social History  Substance Use Topics  . Smoking status: Never Smoker  . Smokeless tobacco: Current User  . Alcohol use No     Allergies   Patient has no known allergies.   Review of Systems Review of Systems  Constitutional: Negative for fever.  HENT: Negative for congestion.   Respiratory: Negative for shortness of breath.   Cardiovascular: Positive for chest pain.  Musculoskeletal: Negative for back pain and neck pain.  Skin: Negative for rash.  Neurological: Negative for headaches.  Hematological: Negative for adenopathy.  Physical Exam Updated Vital Signs BP 133/72 (BP Location: Right Arm)   Pulse 62   Temp 97.8 F (36.6 C) (Oral)   Resp 17   Ht 6\' 2"  (1.88 m)   Wt 99.8 kg (220 lb)   SpO2 100%   BMI 28.25 kg/m   Physical Exam  Constitutional: He appears well-developed.  HENT:  Head: Atraumatic.  Patient appears uncomfortable.  Eyes: Pupils are equal, round, and reactive to light.  Neck: Neck supple.  Cardiovascular: Normal rate.   Pulmonary/Chest: Effort normal. He exhibits tenderness.  Moderate tenderness to left anterior chest wall. No crepitance. No subcutaneous emphysema.  Abdominal: There is tenderness.  Left upper quadrant tenderness without rebound guarding or ecchymosis.  Musculoskeletal:  Normal range of motion. He exhibits no edema.  Neurological: He is alert.  Skin: Skin is warm. Capillary refill takes less than 2 seconds.  Psychiatric: He has a normal mood and affect.     ED Treatments / Results  Labs (all labs ordered are listed, but only abnormal results are displayed) Labs Reviewed  I-STAT CHEM 8, ED - Abnormal; Notable for the following:       Result Value   Chloride 100 (*)    Glucose, Bld 102 (*)    Calcium, Ion 1.12 (*)    All other components within normal limits  CBC WITH DIFFERENTIAL/PLATELET  Rosezena Sensor, ED    EKG  EKG Interpretation  Date/Time:  Monday March 18 2017 11:14:50 EDT Ventricular Rate:  64 PR Interval:    QRS Duration: 105 QT Interval:  389 QTC Calculation: 402 R Axis:   26 Text Interpretation:  Sinus rhythm RSR' in V1 or V2, probably normal variant ST elev, probable normal early repol pattern Confirmed by Rubin Payor  MD, [place] [phone]) on 03/18/2017 11:40:24 AM       Radiology Ct Chest W Contrast  Result Date: 03/18/2017 CLINICAL DATA:  Fall at work.  Worsening left chest pain. EXAM: CT CHEST, ABDOMEN, AND PELVIS WITH CONTRAST TECHNIQUE: Multidetector CT imaging of the chest, abdomen and pelvis was performed following the standard protocol during bolus administration of intravenous contrast. CONTRAST:  ISOVUE-300 IOPAMIDOL (ISOVUE-300) INJECTION 61% COMPARISON:  Chest radiograph from earlier today. FINDINGS: CT CHEST FINDINGS Cardiovascular: Normal heart size. No significant pericardial fluid/thickening. Thoracic aorta is normal in course and caliber. Top-normal caliber main pulmonary artery (3.3 cm diameter). No evidence of acute thoracic aortic injury. No central pulmonary emboli. Mediastinum/Nodes: No pneumomediastinum. No mediastinal hematoma. No discrete thyroid nodules. Unremarkable esophagus. No axillary, mediastinal or hilar lymphadenopathy. Lungs/Pleura: No pneumothorax. No pleural effusion. No acute consolidative  airspace disease, lung masses or new significant pulmonary nodules. No pneumatoceles. Musculoskeletal: No aggressive appearing focal osseous lesions. No fracture detected in the chest. Mild thoracic spondylosis. CT ABDOMEN PELVIS FINDINGS Hepatobiliary: Normal liver with no liver laceration or mass. Normal gallbladder with no radiopaque cholelithiasis. No biliary ductal dilatation. Pancreas: Normal, with no laceration, mass or duct dilation. Spleen: Normal size. No laceration or mass. Adrenals/Urinary Tract: Normal adrenals. No hydronephrosis. No renal laceration. Sub 5 mm hypodense renal cortical lesions in the posterior interpolar kidneys bilaterally, too small to characterize, which require no further follow-up. Normal bladder. Stomach/Bowel: Grossly normal stomach. Normal caliber small bowel with no small bowel wall thickening. Normal appendix. Normal large bowel with no diverticulosis, large bowel wall thickening or pericolonic fat stranding. Vascular/Lymphatic: Normal caliber abdominal aorta. Patent portal, splenic, hepatic and renal veins. No pathologically enlarged lymph nodes in the abdomen or pelvis. Reproductive: Normal size  prostate. Other: No pneumoperitoneum, ascites or focal fluid collection. Musculoskeletal: No aggressive appearing focal osseous lesions. No fracture in the abdomen or pelvis. Mild lumbar spondylosis. IMPRESSION: No acute traumatic injury in the chest, abdomen or pelvis. Electronically Signed   By: Delbert Phenix M.D.   On: 03/18/2017 13:14   Ct Abdomen Pelvis W Contrast  Result Date: 03/18/2017 CLINICAL DATA:  Fall at work.  Worsening left chest pain. EXAM: CT CHEST, ABDOMEN, AND PELVIS WITH CONTRAST TECHNIQUE: Multidetector CT imaging of the chest, abdomen and pelvis was performed following the standard protocol during bolus administration of intravenous contrast. CONTRAST:  ISOVUE-300 IOPAMIDOL (ISOVUE-300) INJECTION 61% COMPARISON:  Chest radiograph from earlier today.  FINDINGS: CT CHEST FINDINGS Cardiovascular: Normal heart size. No significant pericardial fluid/thickening. Thoracic aorta is normal in course and caliber. Top-normal caliber main pulmonary artery (3.3 cm diameter). No evidence of acute thoracic aortic injury. No central pulmonary emboli. Mediastinum/Nodes: No pneumomediastinum. No mediastinal hematoma. No discrete thyroid nodules. Unremarkable esophagus. No axillary, mediastinal or hilar lymphadenopathy. Lungs/Pleura: No pneumothorax. No pleural effusion. No acute consolidative airspace disease, lung masses or new significant pulmonary nodules. No pneumatoceles. Musculoskeletal: No aggressive appearing focal osseous lesions. No fracture detected in the chest. Mild thoracic spondylosis. CT ABDOMEN PELVIS FINDINGS Hepatobiliary: Normal liver with no liver laceration or mass. Normal gallbladder with no radiopaque cholelithiasis. No biliary ductal dilatation. Pancreas: Normal, with no laceration, mass or duct dilation. Spleen: Normal size. No laceration or mass. Adrenals/Urinary Tract: Normal adrenals. No hydronephrosis. No renal laceration. Sub 5 mm hypodense renal cortical lesions in the posterior interpolar kidneys bilaterally, too small to characterize, which require no further follow-up. Normal bladder. Stomach/Bowel: Grossly normal stomach. Normal caliber small bowel with no small bowel wall thickening. Normal appendix. Normal large bowel with no diverticulosis, large bowel wall thickening or pericolonic fat stranding. Vascular/Lymphatic: Normal caliber abdominal aorta. Patent portal, splenic, hepatic and renal veins. No pathologically enlarged lymph nodes in the abdomen or pelvis. Reproductive: Normal size prostate. Other: No pneumoperitoneum, ascites or focal fluid collection. Musculoskeletal: No aggressive appearing focal osseous lesions. No fracture in the abdomen or pelvis. Mild lumbar spondylosis. IMPRESSION: No acute traumatic injury in the chest, abdomen  or pelvis. Electronically Signed   By: Delbert Phenix M.D.   On: 03/18/2017 13:14   Dg Chest Portable 1 View  Result Date: 03/18/2017 CLINICAL DATA:  Chest pain. EXAM: PORTABLE CHEST 1 VIEW COMPARISON:  07/14/2016 . FINDINGS: Mediastinum is stable. Heart size normal. No focal infiltrate. No pleural effusion or pneumothorax. Thoracic spine scoliosis and degenerative change. IMPRESSION: No acute cardiopulmonary disease. Electronically Signed   By: Maisie Fus  Register   On: 03/18/2017 11:59    Procedures Procedures (including critical care time)  Medications Ordered in ED Medications  sodium chloride 0.9 % bolus 500 mL (0 mLs Intravenous Stopped 03/18/17 1448)  morphine 4 MG/ML injection 4 mg (4 mg Intravenous Given 03/18/17 1147)  iopamidol (ISOVUE-300) 61 % injection (100 mLs  Contrast Given 03/18/17 1238)     Initial Impression / Assessment and Plan / ED Course  I have reviewed the triage vital signs and the nursing notes.  Pertinent labs & imaging results that were available during my care of the patient were reviewed by me and considered in my medical decision making (see chart for details).     Patient with fall off low ladder. chest pain. CT scan reassuring. Has pain medicines at home. Will give muscle relaxers. Discharge.  Final Clinical Impressions(s) / ED Diagnoses   Final  diagnoses:  Fall, initial encounter  Chest wall contusion, left, initial encounter    New Prescriptions Discharge Medication List as of 03/18/2017  3:17 PM    START taking these medications   Details  metaxalone (SKELAXIN) 800 MG tablet Take 0.5 tablets (400 mg total) by mouth 3 (three) times daily as needed for muscle spasms., Starting Mon 03/18/2017, Print         Benjiman Core, MD 03/18/17 [phone]

## 2017-03-18 NOTE — ED Triage Notes (Addendum)
Patient arrived to ED via [facility] from work at [employer]. EMS reports: Patient at work on 3 step stool ladder. Ladder moved from underneath patient. Patient fell onto table, striking with L ribcage. Patient guarding L ribcage. Ambulated length of "2 ball fields" after fall. Reported pain sitting.  No LOC. No neck/back pain. Neuro intact.  No blood thinners. Declined IV & Fentanyl.  VSS. BP 106/74. Pulse 82, Resp 16, Pulse ox 96%.

## 2017-03-18 NOTE — ED Notes (Signed)
Patient transported to CT 

## 2017-03-29 ENCOUNTER — Encounter: Payer: Self-pay | Admitting: Family Medicine

## 2017-03-29 ENCOUNTER — Ambulatory Visit (INDEPENDENT_AMBULATORY_CARE_PROVIDER_SITE_OTHER): Payer: Worker's Compensation | Admitting: Family Medicine

## 2017-03-29 VITALS — BP 136/91 | HR 73 | Temp 97.9°F | Resp 18 | Ht 73.5 in | Wt 227.0 lb

## 2017-03-29 DIAGNOSIS — R0781 Pleurodynia: Secondary | ICD-10-CM | POA: Diagnosis not present

## 2017-03-29 DIAGNOSIS — S20222D Contusion of left back wall of thorax, subsequent encounter: Secondary | ICD-10-CM | POA: Diagnosis not present

## 2017-03-29 DIAGNOSIS — S20212D Contusion of left front wall of thorax, subsequent encounter: Secondary | ICD-10-CM

## 2017-03-29 DIAGNOSIS — R0789 Other chest pain: Secondary | ICD-10-CM

## 2017-03-29 MED ORDER — IBUPROFEN 800 MG PO TABS: 800.0000 mg | ORAL_TABLET | Freq: Three times a day (TID) | ORAL | 0 refills | Status: AC | PRN

## 2017-03-29 NOTE — Patient Instructions (Signed)
     IF you received an x-ray today, you will receive an invoice from [facility]. Please contact [facility] at [phone] with questions or concerns regarding your invoice.   IF you received labwork today, you will receive an invoice from [facility]. Please contact [facility] at [phone] with questions or concerns regarding your invoice.   Our billing staff will not be able to assist you with questions regarding bills from these companies.  You will be contacted with the lab results as soon as they are available. The fastest way to get your results is to activate your My Chart account. Instructions are located on the last page of this paperwork. If you have not heard from us regarding the results in 2 weeks, please contact this office.     

## 2017-03-29 NOTE — Progress Notes (Signed)
Chief Complaint  Patient presents with  . Work Related Injury    HPI   This a Work Related Injury Initial injury 03/18/2017 Pt works at [employer] and was evaluated in the ER  He was seen at [facility] He reports that he returned to work Tuesday and Wednesday He states that he has continued pain in his chest, sometimes it is hard for him to even breathe He was diagnosed with a chest contusion He states that he feels comfortable while laying flat on his back or reclined to about 65 degrees in a chair When he sits upright he feels like it causing him muscle pain He was discharged him with metaxalone 800 mg to take 1/2 tablet tid prn He reports that he was given oxycodone which he has not taken because it gave him bizarre dreams.  He has not done physical therapy or rehabilitation. He is taking ibuprofen 800mg  once daily He is using some biofreeze  Past Medical History:  Diagnosis Date  . Arthritis   . Bulging of cervical intervertebral disc   . Chronic insomnia   . HOH (hard of hearing)   . Hypertension   . Migraine     Current Outpatient Prescriptions  Medication Sig Dispense Refill  . amLODipine (NORVASC) 10 MG tablet Take 10 mg by mouth daily as needed (blood pressure increase).     . Armodafinil (NUVIGIL) 50 MG tablet Take 50 mg by mouth daily as needed (for wakefulness).     . metaxalone (SKELAXIN) 800 MG tablet Take 0.5 tablets (400 mg total) by mouth 3 (three) times daily as needed for muscle spasms. 10 tablet 0  . naproxen (NAPROSYN) 500 MG tablet Take 1 tablet (500 mg total) by mouth 2 (two) times daily with a meal. (Patient taking differently: Take 500 mg by mouth 2 (two) times daily as needed for mild pain. ) 30 tablet 0  . oxyCODONE (OXYCONTIN) 80 mg 12 hr tablet Take 40 mg by mouth 2 (two) times daily as needed (pain).     . Testosterone (AXIRON) 30 MG/ACT SOLN Place 60 mg onto the skin daily.    Marland Kitchen. ibuprofen (ADVIL,MOTRIN) 800 MG tablet Take 1 tablet (800 mg total) by  mouth every 8 (eight) hours as needed. 60 tablet 0   No current facility-administered medications for this visit.    Facility-Administered Medications Ordered in Other Visits  Medication Dose Route Frequency Provider Last Rate Last Dose  . gadopentetate dimeglumine (MAGNEVIST) injection 20 mL  20 mL Intravenous Once PRN York SpanielWillis, Charles K, MD        Allergies: No Known Allergies  Past Surgical History:  Procedure Laterality Date  . VASECTOMY      Social History   Social History  . Marital status: Married    Spouse name: N/A  . Number of children: 2  . Years of education: 12   Occupational History  . Mechanic [employer]   Social History Main Topics  . Smoking status: Never Smoker  . Smokeless tobacco: Current User  . Alcohol use No  . Drug use: No  . Sexual activity: Not Asked   Other Topics Concern  . None   Social History Narrative   Lives at home w/ his wife and kids   Right-handed   Drinks 10+ caffeinated beverages per day    ROS Review of Systems See HPI Constitution: No fevers or chills No malaise No diaphoresis Skin: No rash or itching Eyes: no blurry vision, no double vision GU: no dysuria  or hematuria Neuro: no dizziness or headaches  Objective: Vitals:   03/29/17 1056  BP: (!) 136/91  Pulse: 73  Resp: 18  Temp: 97.9 F (36.6 C)  TempSrc: Oral  SpO2: 99%  Weight: 227 lb (103 kg)  Height: 6' 1.5" (1.867 m)    Physical Exam  Constitutional: He is oriented to person, place, and time. He appears well-developed and well-nourished.  HENT:  Head: Normocephalic and atraumatic.  Eyes: Conjunctivae and EOM are normal.  Cardiovascular: Normal rate, regular rhythm and normal heart sounds.   Pulmonary/Chest: Effort normal and breath sounds normal. No respiratory distress. He has no wheezes.        Neurological: He is alert and oriented to person, place, and time.    CT Chest W Contrast (Accession [id number]) (Order [id number])  I Show images  for CT Chest W Contrast  Study Result   CLINICAL DATA:  Fall at work.  Worsening left chest pain.  EXAM: CT CHEST, ABDOMEN, AND PELVIS WITH CONTRAST  TECHNIQUE: Multidetector CT imaging of the chest, abdomen and pelvis was performed following the standard protocol during bolus administration of intravenous contrast.  CONTRAST:  ISOVUE-300 IOPAMIDOL (ISOVUE-300) INJECTION 61%  COMPARISON:  Chest radiograph from earlier today.  FINDINGS: CT CHEST FINDINGS  Cardiovascular: Normal heart size. No significant pericardial fluid/thickening. Thoracic aorta is normal in course and caliber. Top-normal caliber main pulmonary artery (3.3 cm diameter). No evidence of acute thoracic aortic injury. No central pulmonary emboli.  Mediastinum/Nodes: No pneumomediastinum. No mediastinal hematoma. No discrete thyroid nodules. Unremarkable esophagus. No axillary, mediastinal or hilar lymphadenopathy.  Lungs/Pleura: No pneumothorax. No pleural effusion. No acute consolidative airspace disease, lung masses or new significant pulmonary nodules. No pneumatoceles.  Musculoskeletal: No aggressive appearing focal osseous lesions. No fracture detected in the chest. Mild thoracic spondylosis.  CT ABDOMEN PELVIS FINDINGS  Hepatobiliary: Normal liver with no liver laceration or mass. Normal gallbladder with no radiopaque cholelithiasis. No biliary ductal dilatation.  Pancreas: Normal, with no laceration, mass or duct dilation.  Spleen: Normal size. No laceration or mass.  Adrenals/Urinary Tract: Normal adrenals. No hydronephrosis. No renal laceration. Sub 5 mm hypodense renal cortical lesions in the posterior interpolar kidneys bilaterally, too small to characterize, which require no further follow-up. Normal bladder.  Stomach/Bowel: Grossly normal stomach. Normal caliber small bowel with no small bowel wall thickening. Normal appendix. Normal large bowel with no  diverticulosis, large bowel wall thickening or pericolonic fat stranding.  Vascular/Lymphatic: Normal caliber abdominal aorta. Patent portal, splenic, hepatic and renal veins. No pathologically enlarged lymph nodes in the abdomen or pelvis.  Reproductive: Normal size prostate.  Other: No pneumoperitoneum, ascites or focal fluid collection.  Musculoskeletal: No aggressive appearing focal osseous lesions. No fracture in the abdomen or pelvis. Mild lumbar spondylosis.  IMPRESSION: No acute traumatic injury in the chest, abdomen or pelvis.   Electronically Signed   By: Delbert Phenix M.D.   On: 03/18/2017 13:14   Result History       Assessment and Plan Jeffey was seen today for work related injury.  Diagnoses and all orders for this visit:  Chest wall pain Pleuritic pain -     Ambulatory referral to Physical Therapy       -     Advised physical therapy to improve symptoms  Contusion of left side of back, subsequent encounter Contusion of left chest wall, subsequent encounter -     Ambulatory referral to Physical Therapy -     ibuprofen (ADVIL,MOTRIN) 800 MG tablet;  Take 1 tablet (800 mg total) by mouth every 8 (eight) hours as needed.   Gave work note with restrictions Advised pt to take ibuprofen 800mg  tid Discussed physical therapy Discussed topical analgesic like aspercreme with lidocaine   Wilmoth Rasnic A Leda [place]uille

## 2017-05-08 IMAGING — CT CT HEAD W/O CM
3 of 4 series · 18 of 47 positions shown, 21 images · non-contrast
Comparison: None

CLINICAL DATA: 44-year-old male with slurred speech

EXAM:
CT HEAD WITHOUT CONTRAST
TECHNIQUE: Contiguous axial images were obtained from the base of the skull
through the vertex without intravenous contrast.

[Series 201: head w/o, idose (1) · axial · non-contrast · 0.43mm/px · z∈[+77,+212]mm · 12 of 33 slices shown, 15 images]
[im 3/33  brain]
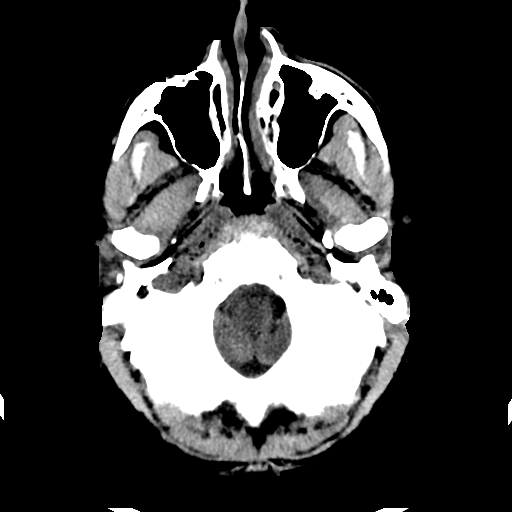
[im 3/33  bone]
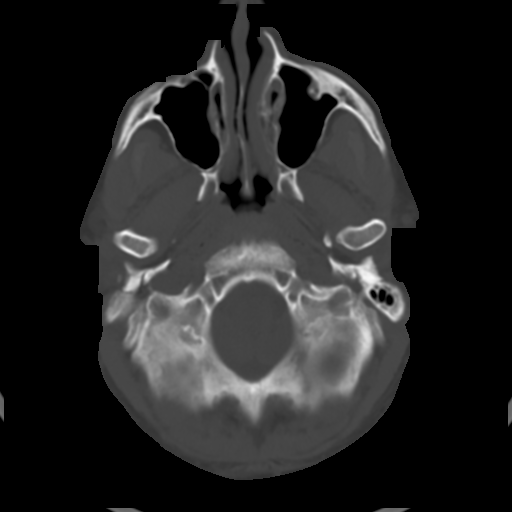
[im 5/33  brain]
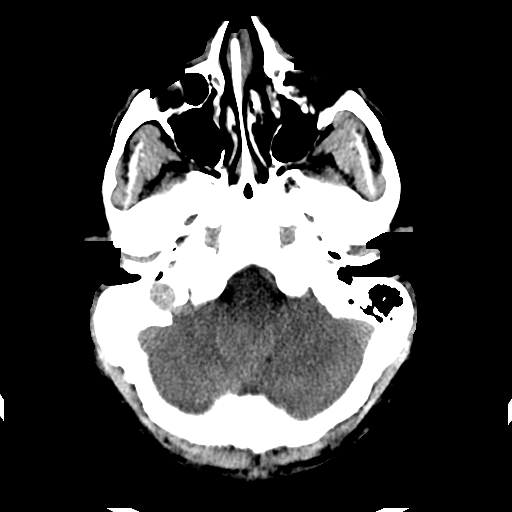
[im 7/33  brain]
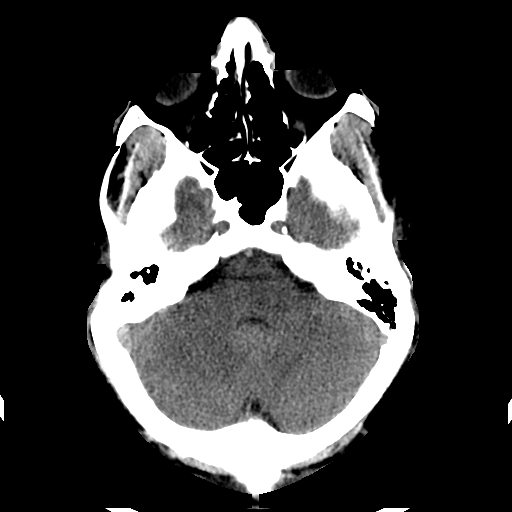
[im 10/33  brain]
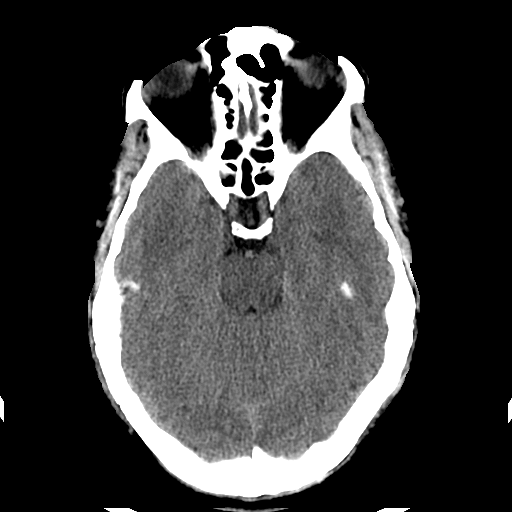
[im 12/33  brain]
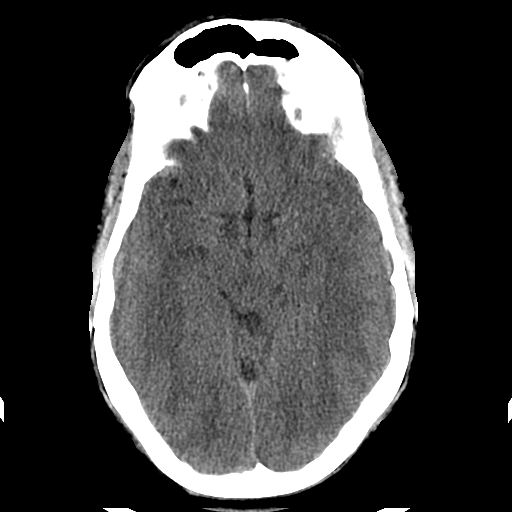
[im 12/33  bone]
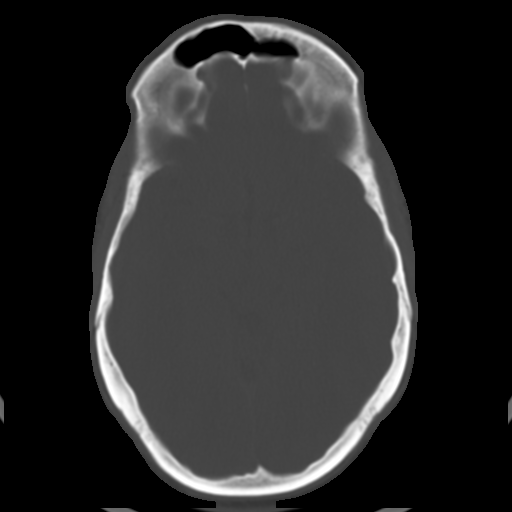
[im 14/33  brain]
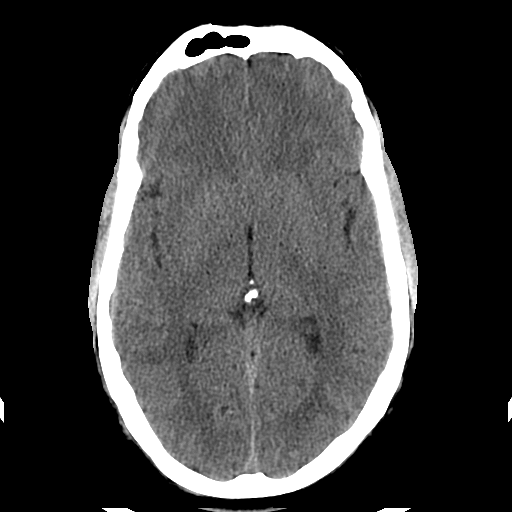
[im 19/33  brain]
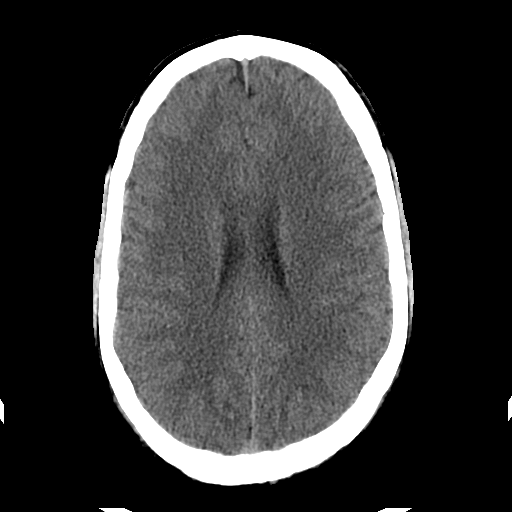
[im 21/33  brain]
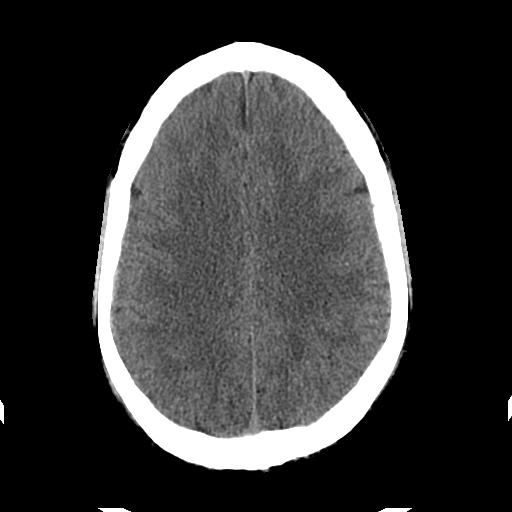
[im 23/33  brain]
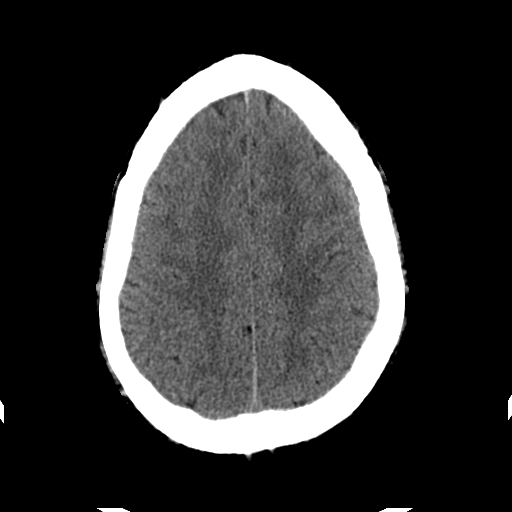
[im 23/33  bone]
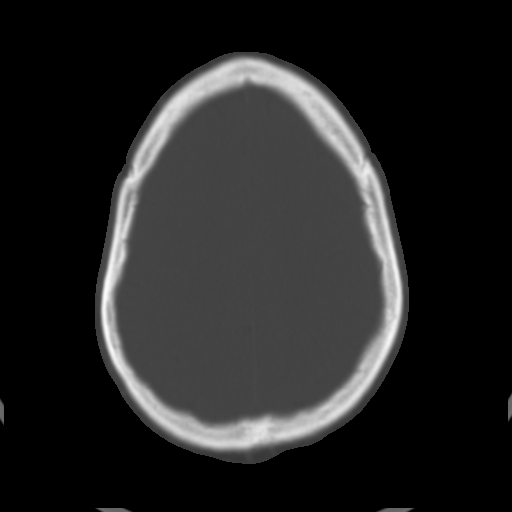
[im 26/33  brain]
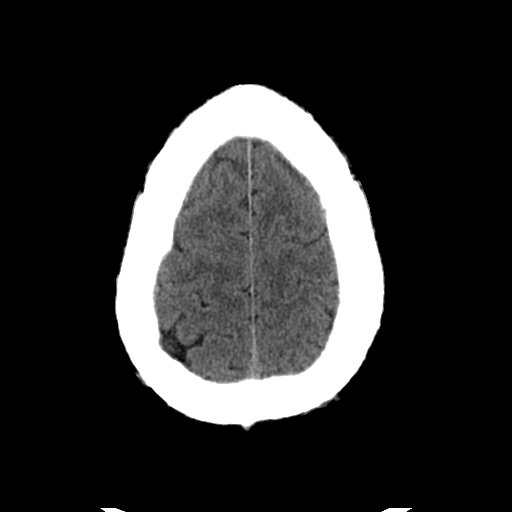
[im 28/33  brain]
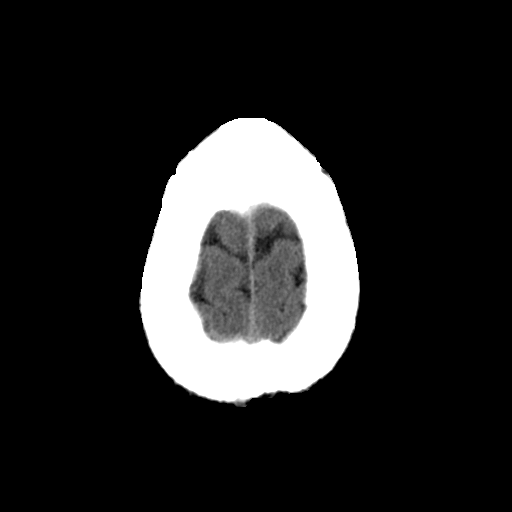
[im 30/33  brain]
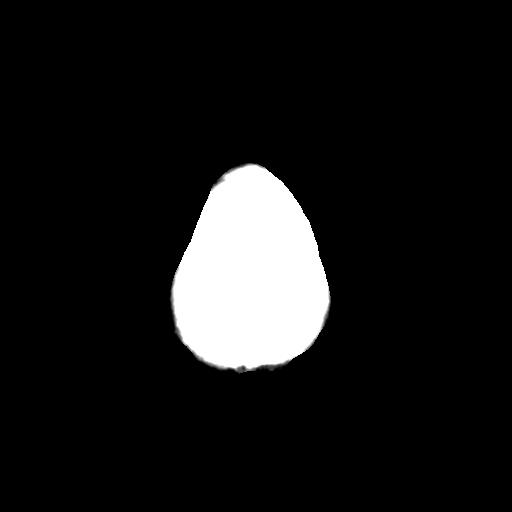

[Series 204: sagittal st, idose (1) · sagittal · 0.40mm/px · 3 of 73 slices shown]
[im 25/73  brain]
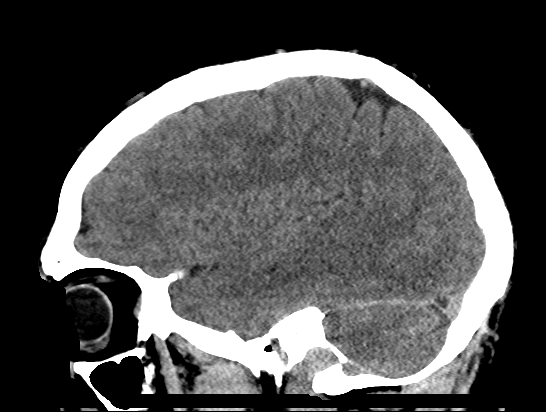
[im 37/73  brain]
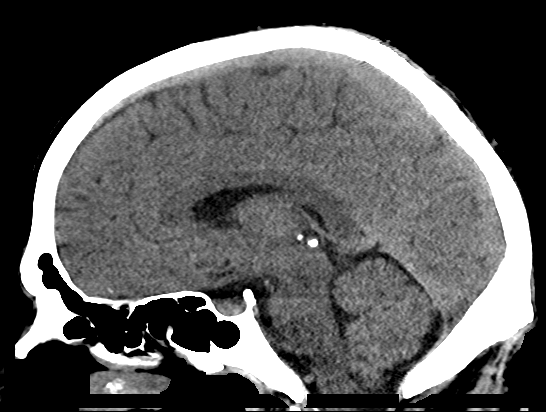
[im 49/73  brain]
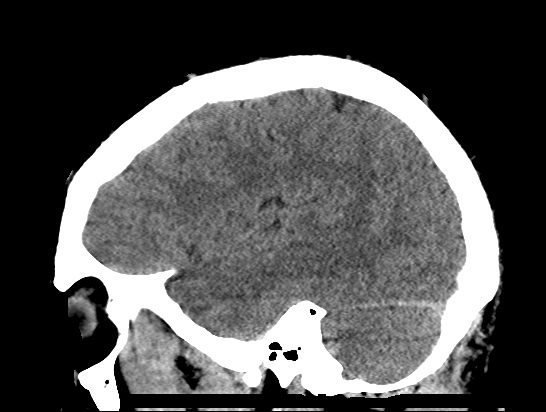

[Series 205: coronal st, idose (1) · coronal · 0.42mm/px · 3 of 73 slices shown]
[im 25/73  brain]
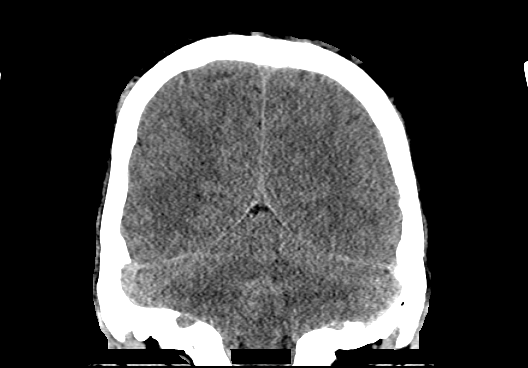
[im 33/73  brain]
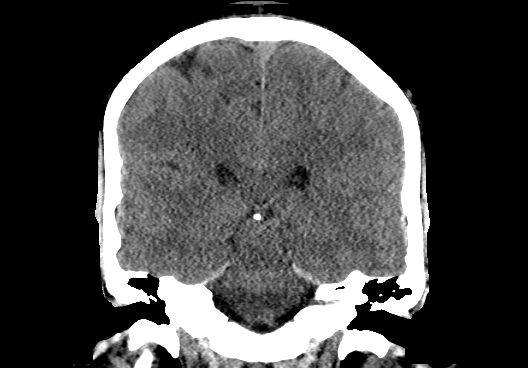
[im 41/73  brain]
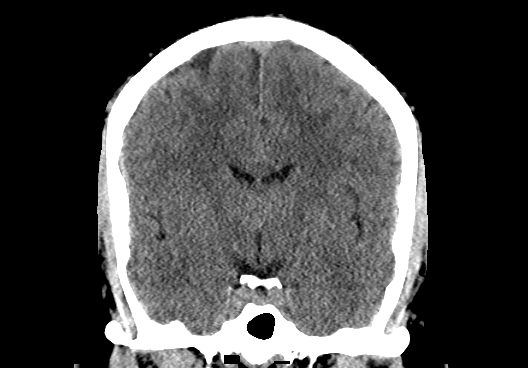

[18 of 47 positions shown; findings below may reference images not displayed]

FINDINGS: The ventricles and the sulci are appropriate in size for the
patient's age. There is no intracranial hemorrhage. No midline shift
or mass effect identified. The gray-white matter differentiation is
preserved.

There is mild mucoperiosteal thickening of the paranasal sinuses. No
air-fluid levels. The mastoid air cells are clear. The calvarium is
intact. The calvarium is intact.
IMPRESSION: No acute intracranial pathology.

## 2018-06-26 IMAGING — DX DG CHEST 1V PORT
1 series · 1 of 1 positions shown · non-contrast
Comparison: 07/14/2016 .

CLINICAL DATA: Chest pain.

EXAM:
PORTABLE CHEST 1 VIEW

[chest]
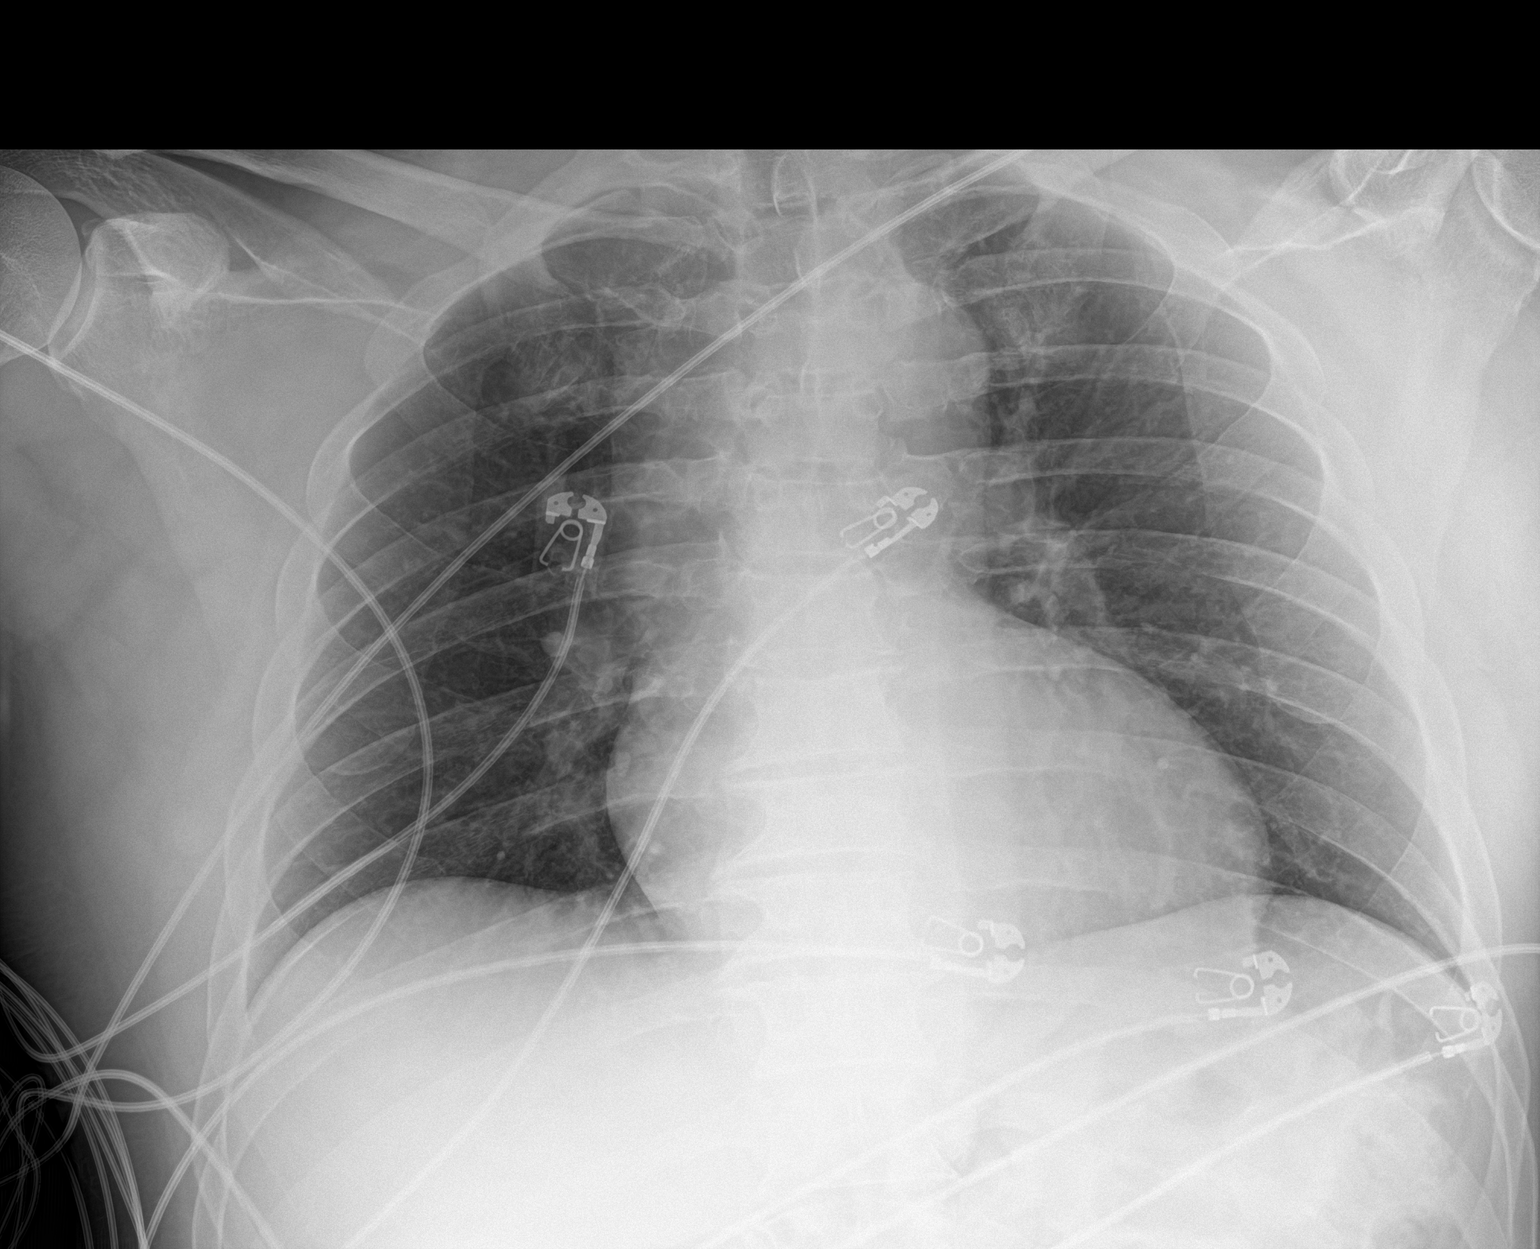

[1 of 1 positions shown; findings below may reference images not displayed]

FINDINGS: Mediastinum is stable. Heart size normal. No focal infiltrate. No
pleural effusion or pneumothorax. Thoracic spine scoliosis and
degenerative change.
IMPRESSION: No acute cardiopulmonary disease.
